# Patient Record
Sex: Female | Born: 1965 | Hispanic: Yes | Marital: Single | Smoking: Never smoker
Health system: Southern US, Community
[De-identification: ages and names within clinical notes are randomized; demographics above are authoritative.]

---

## 2019-01-29 DIAGNOSIS — E049 Nontoxic goiter, unspecified: Secondary | ICD-10-CM | POA: Diagnosis not present

## 2019-07-11 DIAGNOSIS — Z20828 Contact with and (suspected) exposure to other viral communicable diseases: Secondary | ICD-10-CM | POA: Diagnosis not present

## 2019-11-26 DIAGNOSIS — R05 Cough: Secondary | ICD-10-CM | POA: Diagnosis not present

## 2019-11-26 DIAGNOSIS — J069 Acute upper respiratory infection, unspecified: Secondary | ICD-10-CM | POA: Diagnosis not present

## 2019-11-26 DIAGNOSIS — Z9189 Other specified personal risk factors, not elsewhere classified: Secondary | ICD-10-CM | POA: Diagnosis not present

## 2019-11-29 DIAGNOSIS — Z20828 Contact with and (suspected) exposure to other viral communicable diseases: Secondary | ICD-10-CM | POA: Diagnosis not present

## 2020-01-04 DIAGNOSIS — Z20828 Contact with and (suspected) exposure to other viral communicable diseases: Secondary | ICD-10-CM | POA: Diagnosis not present

## 2020-01-04 DIAGNOSIS — Z20822 Contact with and (suspected) exposure to covid-19: Secondary | ICD-10-CM | POA: Diagnosis not present

## 2020-03-23 DIAGNOSIS — Z20822 Contact with and (suspected) exposure to covid-19: Secondary | ICD-10-CM | POA: Diagnosis not present

## 2020-03-23 DIAGNOSIS — Z20828 Contact with and (suspected) exposure to other viral communicable diseases: Secondary | ICD-10-CM | POA: Diagnosis not present

## 2020-03-24 DIAGNOSIS — Z20822 Contact with and (suspected) exposure to covid-19: Secondary | ICD-10-CM | POA: Diagnosis not present

## 2020-12-23 DIAGNOSIS — Z20822 Contact with and (suspected) exposure to covid-19: Secondary | ICD-10-CM | POA: Diagnosis not present

## 2021-02-17 DIAGNOSIS — H00024 Hordeolum internum left upper eyelid: Secondary | ICD-10-CM | POA: Diagnosis not present

## 2021-07-15 ENCOUNTER — Ambulatory Visit
Admission: EM | Admit: 2021-07-15 | Discharge: 2021-07-15 | Disposition: A | Discharge status: Home / Self Care | Payer: Federal, State, Local not specified - PPO | Attending: Urgent Care | Admitting: Urgent Care

## 2021-07-15 ENCOUNTER — Other Ambulatory Visit: Payer: Self-pay

## 2021-07-15 DIAGNOSIS — R52 Pain, unspecified: Secondary | ICD-10-CM

## 2021-07-15 DIAGNOSIS — R519 Headache, unspecified: Secondary | ICD-10-CM

## 2021-07-15 DIAGNOSIS — Z20822 Contact with and (suspected) exposure to covid-19: Secondary | ICD-10-CM

## 2021-07-15 DIAGNOSIS — J069 Acute upper respiratory infection, unspecified: Secondary | ICD-10-CM | POA: Diagnosis not present

## 2021-07-15 MED ORDER — PSEUDOEPHEDRINE HCL 60 MG PO TABS: 60.0000 mg | ORAL_TABLET | Freq: Three times a day (TID) | ORAL | 0 refills | Status: DC | PRN

## 2021-07-15 MED ORDER — PROMETHAZINE-DM 6.25-15 MG/5ML PO SYRP: 5.0000 mL | ORAL_SOLUTION | Freq: Every evening | ORAL | 0 refills | Status: DC | PRN

## 2021-07-15 MED ORDER — CETIRIZINE HCL 10 MG PO TABS: 10.0000 mg | ORAL_TABLET | Freq: Every day | ORAL | 0 refills | Status: AC

## 2021-07-15 MED ORDER — BENZONATATE 100 MG PO CAPS: 100.0000 mg | ORAL_CAPSULE | Freq: Three times a day (TID) | ORAL | 0 refills | Status: DC | PRN

## 2021-07-15 NOTE — ED Provider Notes (Signed)
[facility]   MRN: [id number] DOB: 04-05-1966  Subjective:   Rachael Gamble is a 55 y.o. female presenting for 3-day history of acute onset bilateral ear fullness and pain, sinus congestion, sinus pressure and sinus headaches, sore throat, cough, body aches.  Patient had COVID exposure with her son, tested positive at home.  She took 2 tests and both were negative.  Denies chest pain, shortness of breath, wheezing.  She works at a [employer] and needs to be checked for COVID so that she can return to work.  No current facility-administered medications for this encounter. No current outpatient medications on file.   No Known Allergies  History reviewed. No pertinent past medical history.   History reviewed. No pertinent surgical history.  History reviewed. No pertinent family history.  Social History   Tobacco Use   Smoking status: Never   Smokeless tobacco: Never    ROS   Objective:   Vitals: BP 106/73 (BP Location: Left Arm)   Pulse 82   Temp 98.1 F (36.7 C) (Oral)   Resp 18   SpO2 97%   Physical Exam Constitutional:      General: She is not in acute distress.    Appearance: Normal appearance. She is well-developed. She is not ill-appearing, toxic-appearing or diaphoretic.  HENT:     Head: Normocephalic and atraumatic.     Right Ear: Ear canal and external ear normal. No drainage or tenderness. No middle ear effusion. Tympanic membrane is not erythematous.     Left Ear: Ear canal and external ear normal. No drainage or tenderness.  No middle ear effusion. Tympanic membrane is not erythematous.     Nose: Congestion and rhinorrhea present.     Mouth/Throat:     Mouth: Mucous membranes are moist. No oral lesions.     Pharynx: Oropharynx is clear. No pharyngeal swelling, oropharyngeal exudate, posterior oropharyngeal erythema or uvula swelling.     Tonsils: No tonsillar exudate or tonsillar abscesses.  Eyes:     General: No visual field deficit  or scleral icterus.       Right eye: No discharge.        Left eye: No discharge.     Extraocular Movements: Extraocular movements intact.     Right eye: Normal extraocular motion.     Left eye: Normal extraocular motion.     Conjunctiva/sclera: Conjunctivae normal.     Pupils: Pupils are equal, round, and reactive to light.  Cardiovascular:     Rate and Rhythm: Normal rate and regular rhythm.     Pulses: Normal pulses.     Heart sounds: Normal heart sounds. No murmur heard.   No friction rub. No gallop.  Pulmonary:     Effort: Pulmonary effort is normal. No respiratory distress.     Breath sounds: Normal breath sounds. No stridor. No wheezing, rhonchi or rales.  Musculoskeletal:     Cervical back: Normal range of motion and neck supple.  Lymphadenopathy:     Cervical: No cervical adenopathy.  Skin:    General: Skin is warm and dry.     Findings: No rash.  Neurological:     General: No focal deficit present.     Mental Status: She is alert and oriented to person, place, and time.     Cranial Nerves: No cranial nerve deficit or facial asymmetry.     Motor: No weakness.     Coordination: Romberg sign negative.     Gait: Gait normal.  Psychiatric:  Mood and Affect: Mood normal.        Behavior: Behavior normal.        Thought Content: Thought content normal.        Judgment: Judgment normal.     Assessment and Plan :   PDMP not reviewed this encounter.  1. Viral URI with cough   2. Close exposure to COVID-19 virus   3. Sinus headache   4. Body aches     Will manage for viral illness such as viral URI, viral syndrome, viral rhinitis, COVID-19. Counseled patient on nature of COVID-19 including modes of transmission, diagnostic testing, management and supportive care.  Offered scripts for symptomatic relief. COVID 19 testing is pending. Counseled patient on potential for adverse effects with medications prescribed/recommended today, ER and return-to-clinic precautions  discussed, patient verbalized understanding.     Wallis Bamberg, PA-C 07/15/21 1326

## 2021-07-15 NOTE — Discharge Instructions (Addendum)

## 2021-07-15 NOTE — ED Triage Notes (Signed)
Three day h/o fever, HA, body aches and nausea. Has been taking NyQuil with temporary relief. Emesis x2. Denies diarrhea.

## 2021-07-16 LAB — SARS-COV-2, NAA 2 DAY TAT

## 2021-07-16 LAB — NOVEL CORONAVIRUS, NAA: SARS-CoV-2, NAA: NOT DETECTED

## 2021-07-23 DIAGNOSIS — Z20822 Contact with and (suspected) exposure to covid-19: Secondary | ICD-10-CM | POA: Diagnosis not present

## 2021-07-29 DIAGNOSIS — Z20822 Contact with and (suspected) exposure to covid-19: Secondary | ICD-10-CM | POA: Diagnosis not present

## 2021-08-02 DIAGNOSIS — Z20822 Contact with and (suspected) exposure to covid-19: Secondary | ICD-10-CM | POA: Diagnosis not present

## 2021-08-19 ENCOUNTER — Ambulatory Visit (INDEPENDENT_AMBULATORY_CARE_PROVIDER_SITE_OTHER): Payer: Federal, State, Local not specified - PPO

## 2021-08-19 ENCOUNTER — Ambulatory Visit
Admission: EM | Admit: 2021-08-19 | Discharge: 2021-08-19 | Disposition: A | Discharge status: Home / Self Care | Payer: Federal, State, Local not specified - PPO | Attending: Internal Medicine | Admitting: Internal Medicine

## 2021-08-19 ENCOUNTER — Other Ambulatory Visit: Payer: Self-pay

## 2021-08-19 ENCOUNTER — Encounter: Payer: Self-pay | Admitting: Emergency Medicine

## 2021-08-19 DIAGNOSIS — R059 Cough, unspecified: Secondary | ICD-10-CM

## 2021-08-19 DIAGNOSIS — U099 Post covid-19 condition, unspecified: Secondary | ICD-10-CM

## 2021-08-19 DIAGNOSIS — R0602 Shortness of breath: Secondary | ICD-10-CM | POA: Diagnosis not present

## 2021-08-19 DIAGNOSIS — R053 Chronic cough: Secondary | ICD-10-CM | POA: Diagnosis not present

## 2021-08-19 MED ORDER — PREDNISONE 10 MG (21) PO TBPK: ORAL_TABLET | Freq: Every day | ORAL | 0 refills | Status: DC

## 2021-08-19 MED ORDER — AMOXICILLIN 875 MG PO TABS: 875.0000 mg | ORAL_TABLET | Freq: Two times a day (BID) | ORAL | 0 refills | Status: AC

## 2021-08-19 NOTE — ED Triage Notes (Signed)
Patient was dx w/COVID x 3-4 weeks ago, still coughing, unable to sleep, sometimes hard to catch her breath.  Sinus pain/head and left ear pain.

## 2021-08-19 NOTE — Discharge Instructions (Addendum)
You have been prescribed amoxicillin and steroid to help with symptoms.  Please go the hospital if shortness of breath develops or if symptoms worsen.

## 2021-08-19 NOTE — ED Provider Notes (Signed)
[facility]    CSN: [id number] Arrival date & time: 08/19/21  1006      History   Chief Complaint Chief Complaint  Patient presents with   Cough    HPI Rachael Gamble is a 55 y.o. female.   Patient presents with dry cough, nasal congestion, left ear pain that has been present for approximately 3 to 4 weeks.  Patient was seen on 7/28 for viral upper respiratory infection and had a negative COVID-19 test.  Was prescribed medication to help alleviate symptoms.  Patient has been taking these medications with no relief in symptoms.  States that she had a positive at-home COVID-19 test and a positive PCR test from [facility] approximately 1 week later from urgent care visit.  Denies any known fevers at home.  Denies any chest pain but does have shortness of breath when coughing only.   Cough  History reviewed. No pertinent past medical history.  There are no problems to display for this patient.   History reviewed. No pertinent surgical history.  OB History   No obstetric history on file.      Home Medications    Prior to Admission medications   Medication Sig Start Date End Date Taking? Authorizing Provider  benzonatate (TESSALON) 100 MG capsule Take 1-2 capsules (100-200 mg total) by mouth 3 (three) times daily as needed. 07/15/21   Wallis Bamberg, PA-C  cetirizine (ZYRTEC ALLERGY) 10 MG tablet Take 1 tablet (10 mg total) by mouth daily. 07/15/21   Wallis Bamberg, PA-C  promethazine-dextromethorphan (PROMETHAZINE-DM) 6.25-15 MG/5ML syrup Take 5 mLs by mouth at bedtime as needed for cough. 07/15/21   Wallis Bamberg, PA-C  pseudoephedrine (SUDAFED) 60 MG tablet Take 1 tablet (60 mg total) by mouth every 8 (eight) hours as needed for congestion. 07/15/21   Wallis Bamberg, PA-C    Family History No family history on file.  Social History Social History   Tobacco Use   Smoking status: Never   Smokeless tobacco: Never  Substance Use Topics   Alcohol use: Never   Drug use: Never      Allergies   Patient has no known allergies.   Review of Systems Review of Systems Per HPI  Physical Exam Triage Vital Signs ED Triage Vitals  Enc Vitals Group     BP 08/19/21 1114 116/79     Pulse Rate 08/19/21 1114 72     Resp 08/19/21 1114 20     Temp 08/19/21 1114 98.2 F (36.8 C)     Temp Source 08/19/21 1114 Oral     SpO2 08/19/21 1116 98 %     Weight 08/19/21 1116 139 lb (63 kg)     Height 08/19/21 1116 5\' 2"  (1.575 m)     Head Circumference --      Peak Flow --      Pain Score 08/19/21 1116 0     Pain Loc --      Pain Edu? --      Excl. in GC? --    No data found.  Updated Vital Signs BP 116/79 (BP Location: Right Arm)   Pulse 72   Temp 98.2 F (36.8 C) (Oral)   Resp 20   Ht 5\' 2"  (1.575 m)   Wt 139 lb (63 kg)   SpO2 98%   BMI 25.42 kg/m   Visual Acuity Right Eye Distance:   Left Eye Distance:   Bilateral Distance:    Right Eye Near:   Left Eye Near:  Bilateral Near:     Physical Exam Constitutional:      General: She is not in acute distress.    Appearance: Normal appearance.  HENT:     Head: Normocephalic and atraumatic.     Right Ear: Ear canal normal. A middle ear effusion is present. Tympanic membrane is not erythematous or bulging.     Left Ear: Ear canal normal. A middle ear effusion is present. Tympanic membrane is not erythematous or bulging.     Nose: Congestion present.     Mouth/Throat:     Mouth: Mucous membranes are moist.     Pharynx: No posterior oropharyngeal erythema.  Eyes:     Extraocular Movements: Extraocular movements intact.     Conjunctiva/sclera: Conjunctivae normal.     Pupils: Pupils are equal, round, and reactive to light.  Cardiovascular:     Rate and Rhythm: Normal rate and regular rhythm.     Pulses: Normal pulses.     Heart sounds: Normal heart sounds.  Pulmonary:     Effort: Pulmonary effort is normal. No respiratory distress.     Breath sounds: Normal breath sounds. No wheezing.     Comments:  Harsh cough on exam. Abdominal:     General: Abdomen is flat. Bowel sounds are normal.     Palpations: Abdomen is soft.  Musculoskeletal:        General: Normal range of motion.     Cervical back: Normal range of motion.  Skin:    General: Skin is warm and dry.  Neurological:     General: No focal deficit present.     Mental Status: She is alert and oriented to person, place, and time. Mental status is at baseline.  Psychiatric:        Mood and Affect: Mood normal.        Behavior: Behavior normal.     UC Treatments / Results  Labs (all labs ordered are listed, but only abnormal results are displayed) Labs Reviewed - No data to display  EKG   Radiology No results found.  Procedures Procedures (including critical care time)  Medications Ordered in UC Medications - No data to display  Initial Impression / Assessment and Plan / UC Course  I have reviewed the triage vital signs and the nursing notes.  Pertinent labs & imaging results that were available during my care of the patient were reviewed by me and considered in my medical decision making (see chart for details).     Chest x-ray negative for any acute cardiopulmonary process.  Will treat with Amoxicillin antibiotic due to duration of symptoms and prednisone steroid to decrease inflammation in chest.  Discussed over-the-counter medications to alleviate cough with patient.  Advised patient to go to the hospital shortness if breath develops and is present other than when coughing.  Patient is nontoxic-appearing and does not appear to be in need of immediate medical attention at this time. Discussed strict return precautions. Patient verbalized understanding and is agreeable with plan.  Patient was offered interpreter during patient interaction but patient declined.  Patient voiced understanding to all topics discussed. Final Clinical Impressions(s) / UC Diagnoses   Final diagnoses:  None   Discharge Instructions    None    ED Prescriptions   None    PDMP not reviewed this encounter.   Lance Muss, FNP 08/19/21 1300

## 2021-08-26 ENCOUNTER — Ambulatory Visit
Admission: EM | Admit: 2021-08-26 | Discharge: 2021-08-26 | Disposition: A | Discharge status: Home / Self Care | Payer: Federal, State, Local not specified - PPO | Attending: Urgent Care | Admitting: Urgent Care

## 2021-08-26 ENCOUNTER — Ambulatory Visit (INDEPENDENT_AMBULATORY_CARE_PROVIDER_SITE_OTHER): Payer: Federal, State, Local not specified - PPO

## 2021-08-26 ENCOUNTER — Other Ambulatory Visit: Payer: Self-pay

## 2021-08-26 DIAGNOSIS — S82892A Other fracture of left lower leg, initial encounter for closed fracture: Secondary | ICD-10-CM | POA: Diagnosis not present

## 2021-08-26 DIAGNOSIS — M25572 Pain in left ankle and joints of left foot: Secondary | ICD-10-CM

## 2021-08-26 DIAGNOSIS — W19XXXA Unspecified fall, initial encounter: Secondary | ICD-10-CM | POA: Diagnosis not present

## 2021-08-26 DIAGNOSIS — R600 Localized edema: Secondary | ICD-10-CM | POA: Diagnosis not present

## 2021-08-26 MED ORDER — HYDROCODONE-ACETAMINOPHEN 5-325 MG PO TABS: 1.0000 | ORAL_TABLET | Freq: Four times a day (QID) | ORAL | 0 refills | Status: DC | PRN

## 2021-08-26 MED ORDER — MELOXICAM 7.5 MG PO TABS: 7.5000 mg | ORAL_TABLET | Freq: Every day | ORAL | 0 refills | Status: DC

## 2021-08-26 NOTE — ED Provider Notes (Signed)
[facility]   MRN: [id number] DOB: 1966-07-16  Subjective:   Rachael Gamble is a 55 y.o. female presenting for suffering a left ankle injury 3 days ago.  Patient states that she twisted it and actually came in 2 days ago but we were not able to do x-rays.  Reports that it is still swollen, has bruising now.  Has significant difficulty bearing weight on it.  No current facility-administered medications for this encounter.  Current Outpatient Medications:    amoxicillin (AMOXIL) 875 MG tablet, Take 1 tablet (875 mg total) by mouth 2 (two) times daily for 10 days., Disp: 20 tablet, Rfl: 0   benzonatate (TESSALON) 100 MG capsule, Take 1-2 capsules (100-200 mg total) by mouth 3 (three) times daily as needed., Disp: 60 capsule, Rfl: 0   cetirizine (ZYRTEC ALLERGY) 10 MG tablet, Take 1 tablet (10 mg total) by mouth daily., Disp: 30 tablet, Rfl: 0   predniSONE (STERAPRED UNI-PAK 21 TAB) 10 MG (21) TBPK tablet, Take by mouth daily. Take 6 tabs by mouth daily  for 2 days, then 5 tabs for 2 days, then 4 tabs for 2 days, then 3 tabs for 2 days, 2 tabs for 2 days, then 1 tab by mouth daily for 2 days, Disp: 42 tablet, Rfl: 0   promethazine-dextromethorphan (PROMETHAZINE-DM) 6.25-15 MG/5ML syrup, Take 5 mLs by mouth at bedtime as needed for cough., Disp: 100 mL, Rfl: 0   pseudoephedrine (SUDAFED) 60 MG tablet, Take 1 tablet (60 mg total) by mouth every 8 (eight) hours as needed for congestion., Disp: 30 tablet, Rfl: 0   No Known Allergies  History reviewed. No pertinent past medical history.   History reviewed. No pertinent surgical history.  History reviewed. No pertinent family history.  Social History   Tobacco Use   Smoking status: Never   Smokeless tobacco: Never  Substance Use Topics   Alcohol use: Never   Drug use: Never    ROS   Objective:   Vitals: BP 125/80 (BP Location: Right Arm)   Pulse 66   Temp 98 F (36.7 C) (Oral)   Resp 18   SpO2 98%   Physical  Exam Constitutional:      General: She is not in acute distress.    Appearance: Normal appearance. She is well-developed. She is not ill-appearing.  HENT:     Head: Normocephalic and atraumatic.     Nose: Nose normal.     Mouth/Throat:     Mouth: Mucous membranes are moist.     Pharynx: Oropharynx is clear.  Eyes:     General: No scleral icterus.    Extraocular Movements: Extraocular movements intact.     Pupils: Pupils are equal, round, and reactive to light.  Cardiovascular:     Rate and Rhythm: Normal rate.  Pulmonary:     Effort: Pulmonary effort is normal.  Musculoskeletal:     Left ankle: Swelling and ecchymosis present. No deformity or lacerations. Tenderness present over the lateral malleolus, ATF ligament and AITF ligament. No medial malleolus, CF ligament, posterior TF ligament, base of 5th metatarsal or proximal fibula tenderness. Decreased range of motion.     Left Achilles Tendon: No tenderness or defects. Thompson's test negative.  Skin:    General: Skin is warm and dry.  Neurological:     General: No focal deficit present.     Mental Status: She is alert and oriented to person, place, and time.  Psychiatric:        Mood and  Affect: Mood normal.        Behavior: Behavior normal.    DG Ankle Complete Left  Result Date: 08/26/2021 CLINICAL DATA:  Left ankle pain for 3 days after injury rolling ankle. Pain about the lateral malleolus. EXAM: LEFT ANKLE COMPLETE - 3+ VIEW COMPARISON:  None. FINDINGS: Questionable nondisplaced distal fibular fracture at and just above the level of the ankle mortise, seen only on the oblique view. There is also minimal cortical irregularity about the distal fibular tip. No additional fracture. No mortise widening. Talar dome and base of the fifth metatarsal intact. No significant ankle joint effusion. There is lateral soft tissue edema. Tiny plantar calcaneal spur. IMPRESSION: 1. Suspected distal fibular fracture with 2 areas of questionable  lucency. There is minimal irregularity of the distal fibular tip, as well as an oblique lucency at and just above the level of the ankle mortise. Both of these fractures would be nondisplaced. 2. Lateral soft tissue edema. Electronically Signed   By: Narda Rutherford M.D.   On: 08/26/2021 19:09     Assessment and Plan :   PDMP not reviewed this encounter.  1. Closed fracture of left ankle, initial encounter   2. Acute left ankle pain     Patient placed in a short leg posterior splint.  Use meloxicam, hydrocodone for breakthrough pain.  Ambulate with crutches and in general being nonweightbearing.  Follow-up with an orthopedist as soon as possible. Counseled patient on potential for adverse effects with medications prescribed/recommended today, ER and return-to-clinic precautions discussed, patient verbalized understanding.    Wallis Bamberg, [place] 08/26/21 Rachael Gamble

## 2021-08-26 NOTE — ED Triage Notes (Signed)
Pt "twisted" ankle 3 days ago. States she was here 2 days ago but we did not have xray ability. States it's still hurting. It is clearly edematous and discolored.

## 2021-08-30 ENCOUNTER — Other Ambulatory Visit: Payer: Self-pay | Admitting: Orthopedic Surgery

## 2021-08-30 DIAGNOSIS — S82892A Other fracture of left lower leg, initial encounter for closed fracture: Secondary | ICD-10-CM

## 2021-08-30 DIAGNOSIS — M25572 Pain in left ankle and joints of left foot: Secondary | ICD-10-CM | POA: Diagnosis not present

## 2021-09-03 ENCOUNTER — Ambulatory Visit
Admission: RE | Admit: 2021-09-03 | Discharge: 2021-09-03 | Disposition: A | Discharge status: Home / Self Care | Payer: Federal, State, Local not specified - PPO | Source: Ambulatory Visit | Attending: Orthopedic Surgery | Admitting: Orthopedic Surgery

## 2021-09-03 DIAGNOSIS — S82422A Displaced transverse fracture of shaft of left fibula, initial encounter for closed fracture: Secondary | ICD-10-CM | POA: Diagnosis not present

## 2021-09-03 DIAGNOSIS — S82892A Other fracture of left lower leg, initial encounter for closed fracture: Secondary | ICD-10-CM

## 2021-09-08 DIAGNOSIS — M25572 Pain in left ankle and joints of left foot: Secondary | ICD-10-CM | POA: Diagnosis not present

## 2021-10-01 DIAGNOSIS — N644 Mastodynia: Secondary | ICD-10-CM | POA: Diagnosis not present

## 2021-10-01 DIAGNOSIS — L659 Nonscarring hair loss, unspecified: Secondary | ICD-10-CM | POA: Diagnosis not present

## 2021-10-01 DIAGNOSIS — J302 Other seasonal allergic rhinitis: Secondary | ICD-10-CM | POA: Diagnosis not present

## 2021-10-01 DIAGNOSIS — Z0000 Encounter for general adult medical examination without abnormal findings: Secondary | ICD-10-CM | POA: Diagnosis not present

## 2021-10-01 DIAGNOSIS — E663 Overweight: Secondary | ICD-10-CM | POA: Diagnosis not present

## 2021-10-08 DIAGNOSIS — M25572 Pain in left ankle and joints of left foot: Secondary | ICD-10-CM | POA: Diagnosis not present

## 2021-11-15 DIAGNOSIS — Z1211 Encounter for screening for malignant neoplasm of colon: Secondary | ICD-10-CM | POA: Diagnosis not present

## 2022-02-18 IMAGING — CT CT ANKLE*L* W/O CM
3 series · 16 of 33 positions shown, 19 images · non-contrast
Comparison: 08/26/2021 radiographs

CLINICAL DATA: Left ankle pain, ankle inversion injury.
Questionable lateral malleolar fracture on radiography.

EXAM:
CT OF THE LEFT ANKLE WITHOUT CONTRAST
TECHNIQUE: Multidetector CT imaging of the left ankle was performed according
to the standard protocol. Multiplanar CT image reconstructions were
also generated.

[Series 5: sfov lower extremity 2.00 br40 s3 soft · axial · 0.31mm/px · z∈[+507,+639]mm · 8 of 78 slices shown, 10 images (1 of 3)]
[im 6/78  soft-tissue]
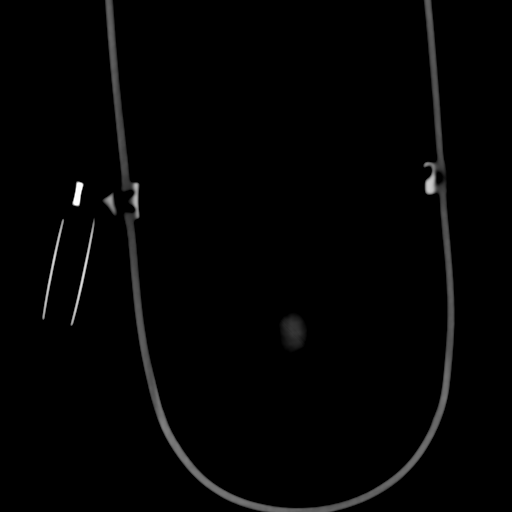
[im 6/78  bone]
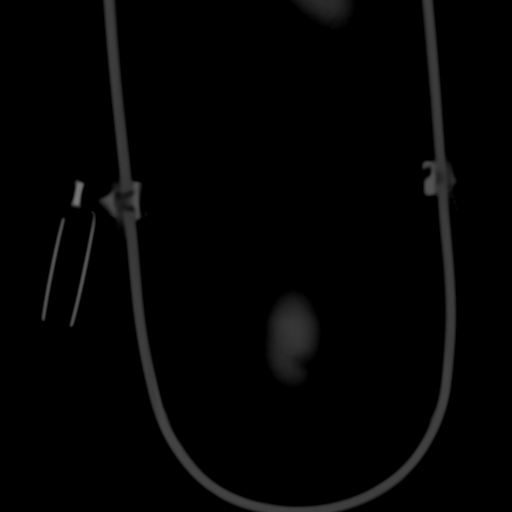
[im 18/78  bone]
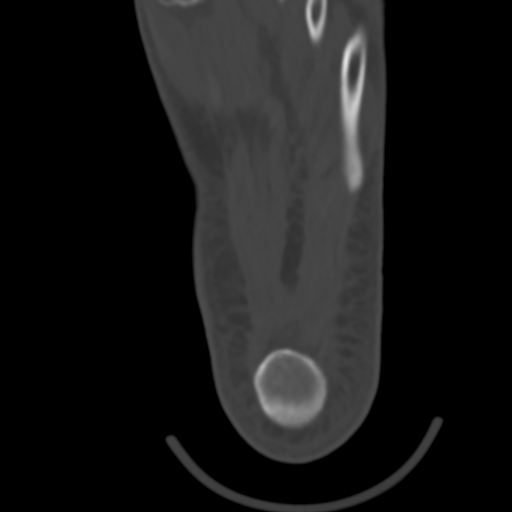
[im 24/78  bone]
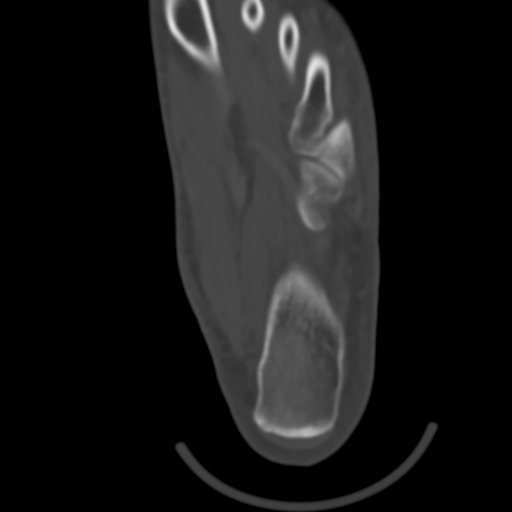
[im 36/78  bone]
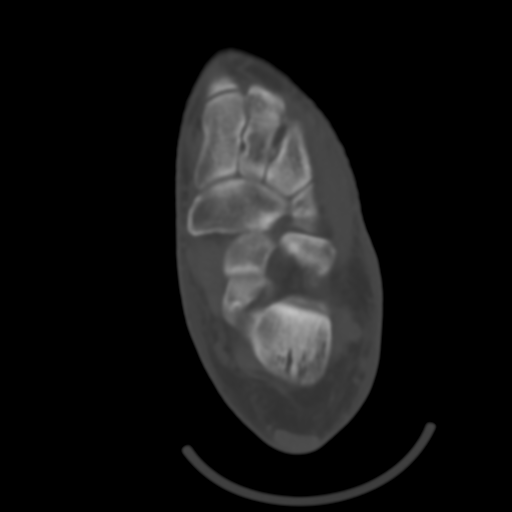
[im 42/78  soft-tissue]
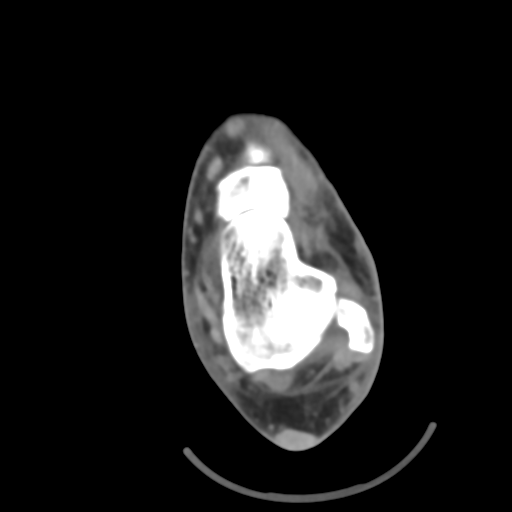
[im 42/78  bone]
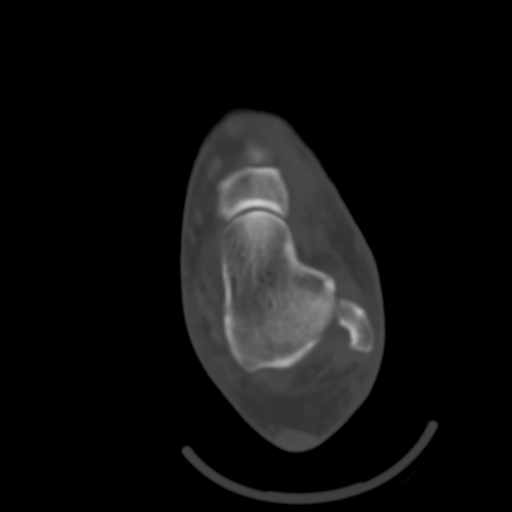
[im 54/78  bone]
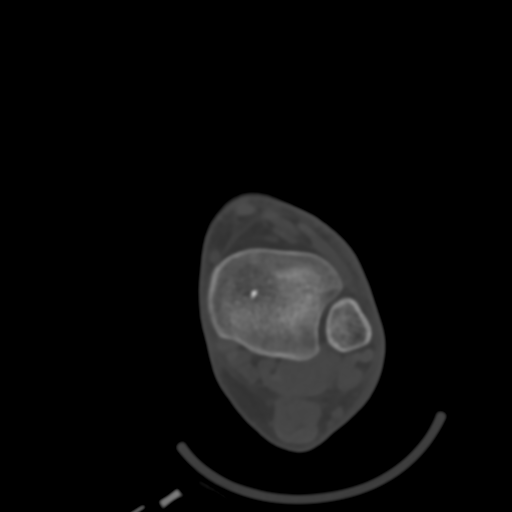
[im 60/78  bone]
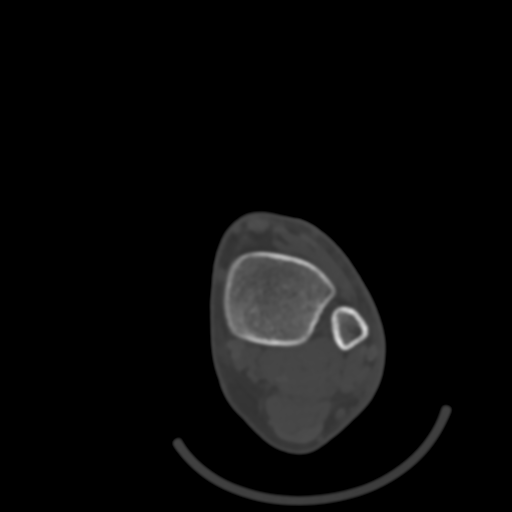
[im 72/78  bone]
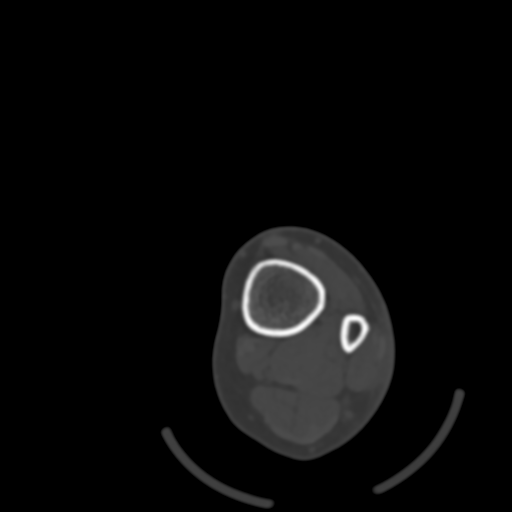

[Series 9: sfov lower extremity 2.00 br40 s3 soft · coronal · 0.31mm/px · 3 of 80 slices shown (2 of 3)]
[im 16/80  bone]
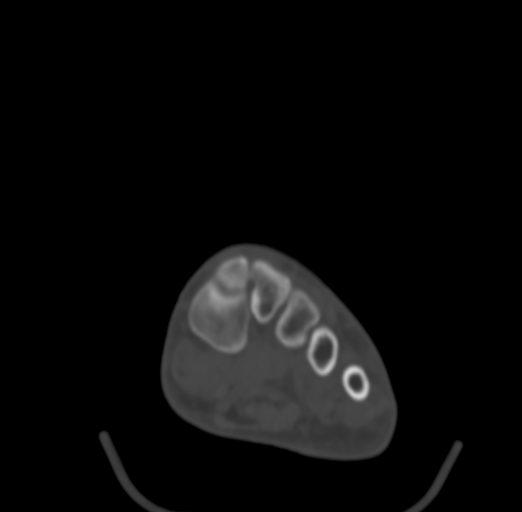
[im 32/80  bone]
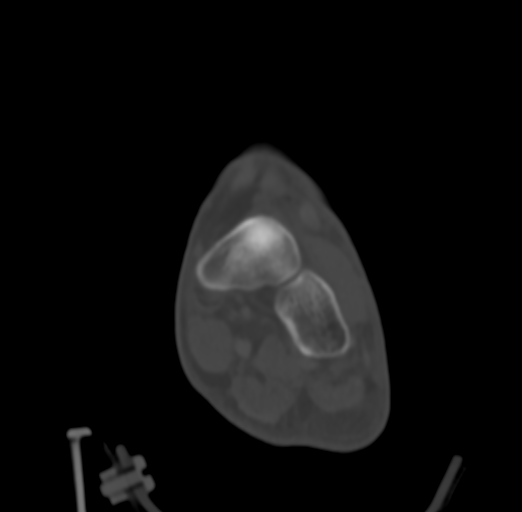
[im 48/80  bone]
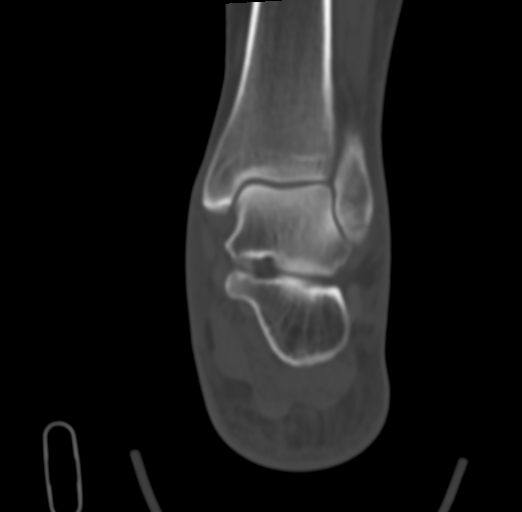

[Series 13: sfov lower extremity 2.00 br40 s3 soft · sagittal · 0.31mm/px · 5 of 80 slices shown, 6 images (3 of 3)]
[im 27/80  bone]
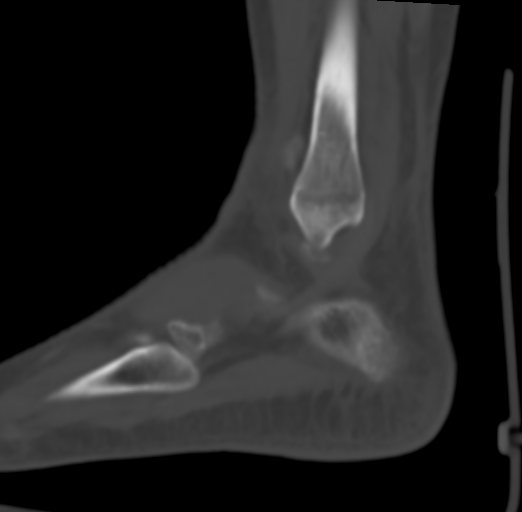
[im 33/80  bone]
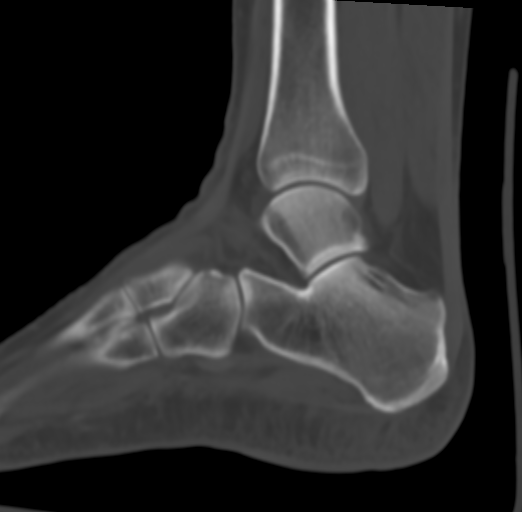
[im 40/80  soft-tissue]
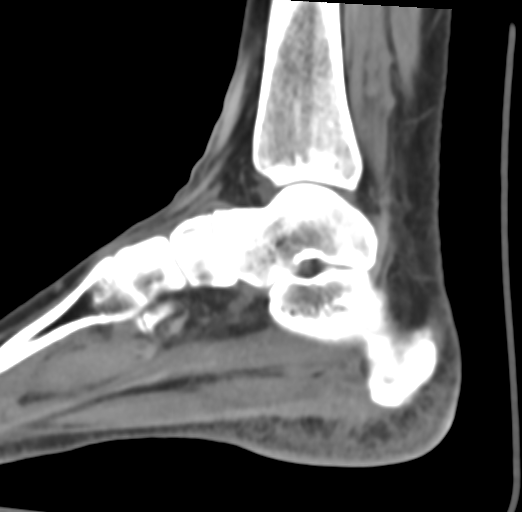
[im 40/80  bone]
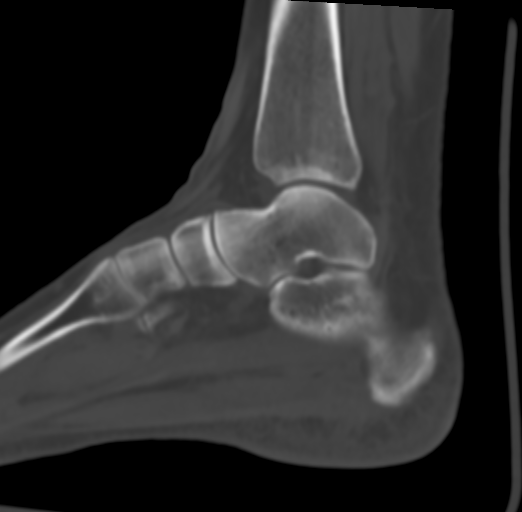
[im 47/80  bone]
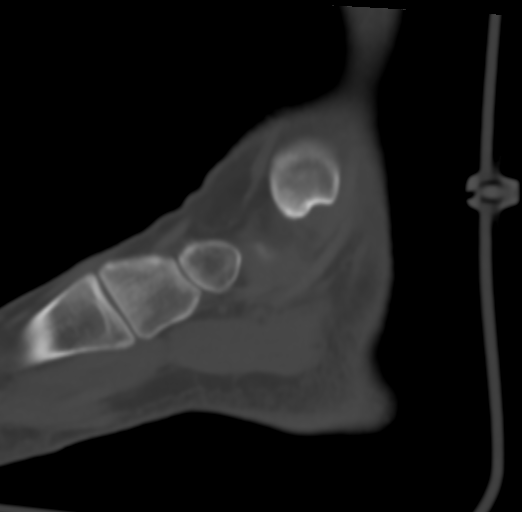
[im 53/80  bone]
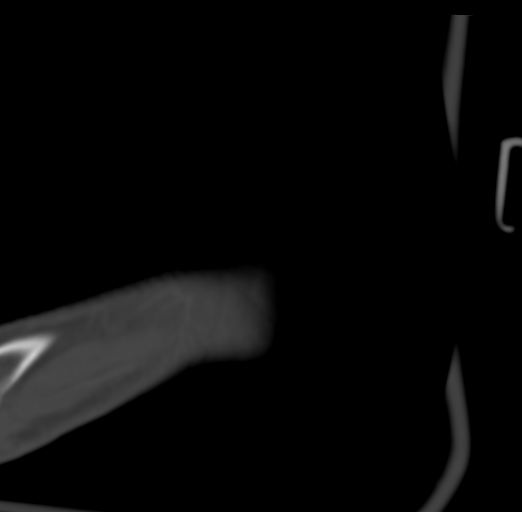

[16 of 33 positions shown; findings below may reference images not displayed]

FINDINGS: Bones/Joint/Cartilage

As shown on images 54-58 of series 11 and image 52 of series 7,
there is linear nondisplaced fracture the tip of the distal tip of
the fibula

Likely incidental linear calcification along the margin of the
peroneus longus tendon on image 51 of series 11.

Ligaments

Suboptimally assessed by CT.

Muscles and Tendons

Thickened medial band of the plantar fascia suspicious for possible
plantar fasciitis. Overlying mild subcutaneous edema in the heel.

Soft tissues

Mild subcutaneous edema overlies both the medial and lateral
malleoli.
IMPRESSION: 1. Transverse fracture of the tip of the fibula, including the
bottom 2-3 mm of the distal fibular tip.
2. Thickened medial band of the plantar fascia suspicious for
plantar fasciitis.

## 2022-04-29 ENCOUNTER — Ambulatory Visit
Admission: EM | Admit: 2022-04-29 | Discharge: 2022-04-29 | Disposition: A | Discharge status: Home / Self Care | Payer: Federal, State, Local not specified - PPO | Attending: Family Medicine | Admitting: Family Medicine

## 2022-04-29 DIAGNOSIS — H1131 Conjunctival hemorrhage, right eye: Secondary | ICD-10-CM | POA: Diagnosis not present

## 2022-04-29 NOTE — ED Provider Notes (Signed)
?[facility] ? ? ? ?CSN: [id number] ?Arrival date & time: 04/29/22  6834 ? ? ?  ? ?History   ?Chief Complaint ?Chief Complaint  ?Patient presents with  ? right eye bleeding  ? ? ?HPI ?Rachael Gamble is a 56 y.o. female.  ? ?HPI ?Here for some irritation in her right eye and blood in the right lower conjunctiva.  This morning when she woke up no abnormalities to her right eye.  Then on the way to work she cried a good bit, thinking about her mother.  Then her right eye started feeling irritated and she noted blood in her lower right eye. ? ?She is worried because she has had what sounds like laser surgery for glaucoma a few years ago.  Last eye checkup was close to a year ago with a history clinic. ? ?MyChart exam shows her right sided vision to be 20/200 and left-sided 20/70 ? ?History reviewed. No pertinent past medical history. ? ?There are no problems to display for this patient. ? ? ?History reviewed. No pertinent surgical history. ? ?OB History   ?No obstetric history on file. ?  ? ? ? ?Home Medications   ? ?Prior to Admission medications   ?Medication Sig Start Date End Date Taking? Authorizing Provider  ?cetirizine (ZYRTEC ALLERGY) 10 MG tablet Take 1 tablet (10 mg total) by mouth daily. 07/15/21   Wallis Bamberg, PA-C  ?meloxicam (MOBIC) 7.5 MG tablet Take 1 tablet (7.5 mg total) by mouth daily. 08/26/21   Wallis Bamberg, PA-C  ? ? ?Family History ?History reviewed. No pertinent family history. ? ?Social History ?Social History  ? ?Tobacco Use  ? Smoking status: Never  ? Smokeless tobacco: Never  ?Substance Use Topics  ? Alcohol use: Never  ? Drug use: Never  ? ? ? ?Allergies   ?Patient has no known allergies. ? ? ?Review of Systems ?Review of Systems ? ? ?Physical Exam ?Triage Vital Signs ?ED Triage Vitals [04/29/22 0903]  ?Enc Vitals Group  ?   BP 136/83  ?   Pulse Rate 72  ?   Resp 18  ?   Temp 98.2 ?F (36.8 ?C)  ?   Temp Source Oral  ?   SpO2 98 %  ?   Weight   ?   Height   ?   Head Circumference   ?    Peak Flow   ?   Pain Score 0  ?   Pain Loc   ?   Pain Edu?   ?   Excl. in GC?   ? ?No data found. ? ?Updated Vital Signs ?BP 136/83 (BP Location: Left Arm)   Pulse 72   Temp 98.2 ?F (36.8 ?C) (Oral)   Resp 18   SpO2 98%  ? ?Visual Acuity ?Right Eye Distance: 20/200 ?Left Eye Distance: 20/70 ?Bilateral Distance: 20/70 ? ?Right Eye Near:   ?Left Eye Near:    ?Bilateral Near:    ? ?Physical Exam ?Vitals reviewed.  ?Constitutional:   ?   General: She is not in acute distress. ?   Appearance: She is not toxic-appearing.  ?HENT:  ?   Mouth/Throat:  ?   Mouth: Mucous membranes are moist.  ?Eyes:  ?   Extraocular Movements: Extraocular movements intact.  ?   Pupils: Pupils are equal, round, and reactive to light.  ?   Comments: Conjunctival hemorrhage on the lower portion of her right eye across the entirety of the eye.  The lids are not  swollen she does have some tearing  ?Skin: ?   Coloration: Skin is not pale.  ?Neurological:  ?   Mental Status: She is alert and oriented to person, place, and time.  ?Psychiatric:     ?   Behavior: Behavior normal.  ? ? ? ?UC Treatments / Results  ?Labs ?(all labs ordered are listed, but only abnormal results are displayed) ?Labs Reviewed - No data to display ? ?EKG ? ? ?Radiology ?No results found. ? ?Procedures ?Procedures (including critical care time) ? ?Medications Ordered in UC ?Medications - No data to display ? ?Initial Impression / Assessment and Plan / UC Course  ?I have reviewed the triage vital signs and the nursing notes. ? ?Pertinent labs & imaging results that were available during my care of the patient were reviewed by me and considered in my medical decision making (see chart for details). ? ?  ? ?It is unclear if the difference in visual acuity is due to her previous surgery and eye problems.  Exam is consistent with a subconjunctival hemorrhage and not glaucoma.  She is given contact information for ophthalmology.  If she is worsening in any way in these next few  days she should proceed to the emergency room for slit-lamp exam. ?Final Clinical Impressions(s) / UC Diagnoses  ? ?Final diagnoses:  ?Subconjunctival hemorrhage of right eye  ? ? ? ?Discharge Instructions   ? ?  ?Your exam is most consistent with a subconjunctival hemorrhage. It can take a couple of weeks for it to resolve. ? ?Get an over the counter eye drop like Refresh or Systane, and keep it in the fridge. You can use it as needed for the eye irritation. ? ?If your symptoms worsen, go to the ER for urgent evaluation.  ? ?Also consider calling the ophthalmologist office today to see if they could evaluate you. ? ? ? ? ?ED Prescriptions   ?None ?  ? ?PDMP not reviewed this encounter. ?  ?Zenia Resides, MD ?04/29/22 [phone] ? ?

## 2022-04-29 NOTE — ED Triage Notes (Signed)
Pt c/o vision changes, right eye pain, bleeding in right conjunctiva. Onset this morning. Denies any direct trauma or injury but states was crying this morning and that's the only thing she can think of that would have affected her eyes. States when she woke up her eyes were normal but by the time she got to work she noticed marked changes. Associated dizziness and nausea.  ?

## 2022-04-29 NOTE — Discharge Instructions (Addendum)
Your exam is most consistent with a subconjunctival hemorrhage. It can take a couple of weeks for it to resolve. ? ?Get an over the counter eye drop like Refresh or Systane, and keep it in the fridge. You can use it as needed for the eye irritation. ? ?If your symptoms worsen, go to the ER for urgent evaluation.  ? ?Also consider calling the ophthalmologist office today to see if they could evaluate you. ?

## 2022-05-03 DIAGNOSIS — H00021 Hordeolum internum right upper eyelid: Secondary | ICD-10-CM | POA: Diagnosis not present

## 2022-11-07 DIAGNOSIS — Z0000 Encounter for general adult medical examination without abnormal findings: Secondary | ICD-10-CM | POA: Diagnosis not present

## 2022-11-07 DIAGNOSIS — R7301 Impaired fasting glucose: Secondary | ICD-10-CM | POA: Diagnosis not present

## 2022-11-07 DIAGNOSIS — Z124 Encounter for screening for malignant neoplasm of cervix: Secondary | ICD-10-CM | POA: Diagnosis not present

## 2022-11-07 DIAGNOSIS — E7800 Pure hypercholesterolemia, unspecified: Secondary | ICD-10-CM | POA: Diagnosis not present

## 2022-11-21 DIAGNOSIS — H04123 Dry eye syndrome of bilateral lacrimal glands: Secondary | ICD-10-CM | POA: Diagnosis not present

## 2022-11-21 DIAGNOSIS — H40033 Anatomical narrow angle, bilateral: Secondary | ICD-10-CM | POA: Diagnosis not present

## 2023-01-02 ENCOUNTER — Ambulatory Visit
Admission: EM | Admit: 2023-01-02 | Discharge: 2023-01-02 | Disposition: A | Discharge status: Home / Self Care | Payer: Federal, State, Local not specified - PPO | Attending: Physician Assistant | Admitting: Physician Assistant

## 2023-01-02 ENCOUNTER — Ambulatory Visit (INDEPENDENT_AMBULATORY_CARE_PROVIDER_SITE_OTHER): Payer: Federal, State, Local not specified - PPO

## 2023-01-02 DIAGNOSIS — R051 Acute cough: Secondary | ICD-10-CM | POA: Diagnosis not present

## 2023-01-02 DIAGNOSIS — J209 Acute bronchitis, unspecified: Secondary | ICD-10-CM | POA: Diagnosis not present

## 2023-01-02 DIAGNOSIS — R0602 Shortness of breath: Secondary | ICD-10-CM

## 2023-01-02 DIAGNOSIS — R059 Cough, unspecified: Secondary | ICD-10-CM | POA: Diagnosis not present

## 2023-01-02 MED ORDER — AEROCHAMBER PLUS FLO-VU MEDIUM MISC
1.0000 | Freq: Once | Status: AC
  Administered 2023-01-02: 1

## 2023-01-02 MED ORDER — BENZONATATE 100 MG PO CAPS: 100.0000 mg | ORAL_CAPSULE | Freq: Three times a day (TID) | ORAL | 0 refills | Status: DC

## 2023-01-02 MED ORDER — ALBUTEROL SULFATE HFA 108 (90 BASE) MCG/ACT IN AERS
2.0000 | INHALATION_SPRAY | Freq: Once | RESPIRATORY_TRACT | Status: AC
  Administered 2023-01-02: 2 via RESPIRATORY_TRACT

## 2023-01-02 MED ORDER — PREDNISONE 10 MG PO TABS: 10.0000 mg | ORAL_TABLET | Freq: Three times a day (TID) | ORAL | 0 refills | Status: DC

## 2023-01-02 NOTE — ED Provider Notes (Signed)
[facility]    CSN: [id number] Arrival date & time: 01/02/23  1306      History   Chief Complaint Chief Complaint  Patient presents with   Cough    HPI Rachael Gamble is a 57 y.o. female.   57 year old female presents with chronic cough.  Patient indicates she had a respiratory infection about 6 weeks ago.  She indicates it was not COVID.  Since having the respiratory infection she has been having a chronic cough for the past 6 weeks.  She relates the cough is dry, persistent, and she has having coughing exacerbations.  She occasionally has shortness of breath with the cough however there is no noted wheezing.  She indicates she has taken multiple OTC cough preparations without improvement in her symptoms.  Indicates she is not having any fever, chills, upper respiratory symptoms.  She indicates that she is a non-smoker.  She does indicate that she had COVID last year and had similar symptoms with a chronic nagging cough but it improved with a course of steroids.  She is currently tolerating fluids well.   Cough Associated symptoms: shortness of breath     History reviewed. No pertinent past medical history.  There are no problems to display for this patient.   History reviewed. No pertinent surgical history.  OB History   No obstetric history on file.      Home Medications    Prior to Admission medications   Medication Sig Start Date End Date Taking? Authorizing Provider  benzonatate (TESSALON) 100 MG capsule Take 1 capsule (100 mg total) by mouth every 8 (eight) hours. 01/02/23  Yes Nyoka Lint, PA-C  predniSONE (DELTASONE) 10 MG tablet Take 1 tablet (10 mg total) by mouth in the morning, at noon, and at bedtime. 01/02/23  Yes Nyoka Lint, PA-C  cetirizine (ZYRTEC ALLERGY) 10 MG tablet Take 1 tablet (10 mg total) by mouth daily. 07/15/21   Jaynee Eagles, PA-C  meloxicam (MOBIC) 7.5 MG tablet Take 1 tablet (7.5 mg total) by mouth daily. 08/26/21   Jaynee Eagles,  PA-C    Family History Family History  Family history unknown: Yes    Social History Social History   Tobacco Use   Smoking status: Never   Smokeless tobacco: Never  Substance Use Topics   Alcohol use: Never   Drug use: Never     Allergies   Patient has no known allergies.   Review of Systems Review of Systems  Respiratory:  Positive for cough and shortness of breath.      Physical Exam Triage Vital Signs ED Triage Vitals  Enc Vitals Group     BP 01/02/23 1314 104/69     Pulse Rate 01/02/23 1312 80     Resp 01/02/23 1312 17     Temp 01/02/23 1312 97.8 F (36.6 C)     Temp Source 01/02/23 1312 Oral     SpO2 01/02/23 1312 96 %     Weight --      Height --      Head Circumference --      Peak Flow --      Pain Score 01/02/23 1312 1     Pain Loc --      Pain Edu? --      Excl. in [place]? --    No data found.  Updated Vital Signs BP 104/69   Pulse 80   Temp 97.8 F (36.6 C) (Oral)   Resp 17   SpO2 96%  Visual Acuity Right Eye Distance:   Left Eye Distance:   Bilateral Distance:    Right Eye Near:   Left Eye Near:    Bilateral Near:     Physical Exam Constitutional:      Appearance: Normal appearance.  HENT:     Right Ear: Tympanic membrane and ear canal normal.     Left Ear: Tympanic membrane and ear canal normal.     Mouth/Throat:     Mouth: Mucous membranes are moist.     Pharynx: Oropharynx is clear.  Cardiovascular:     Rate and Rhythm: Normal rate and regular rhythm.     Heart sounds: Normal heart sounds.  Pulmonary:     Effort: Pulmonary effort is normal.     Breath sounds: Normal breath sounds and air entry. No wheezing, rhonchi or rales.  Lymphadenopathy:     Cervical: No cervical adenopathy.  Neurological:     Mental Status: She is alert.      UC Treatments / Results  Labs (all labs ordered are listed, but only abnormal results are displayed) Labs Reviewed - No data to display  EKG   Radiology DG Chest 2  View  Result Date: 01/02/2023 CLINICAL DATA:  Cough mild shortness of breath over the last 6 weeks EXAM: CHEST - 2 VIEW COMPARISON:  08/19/2021 FINDINGS: The heart size and mediastinal contours are within normal limits. Both lungs are clear. The visualized skeletal structures are unremarkable. Bilateral breast implants noted. IMPRESSION: 1. No active cardiopulmonary disease is radiographically apparent. Electronically Signed   By: Gaylyn Rong M.D.   On: 01/02/2023 13:59    Procedures Procedures (including critical care time)  Medications Ordered in UC Medications  albuterol (VENTOLIN HFA) 108 (90 Base) MCG/ACT inhaler 2 puff (2 puffs Inhalation Provided for home use 01/02/23 1406)  AeroChamber Plus Flo-Vu Medium MISC 1 each (1 each Other Provided for home use 01/02/23 1406)    Initial Impression / Assessment and Plan / UC Course  I have reviewed the triage vital signs and the nursing notes.  Pertinent labs & imaging results that were available during my care of the patient were reviewed by me and considered in my medical decision making (see chart for details).    Plan: 1.  The cough will be treated with the following: A.  Tessalon Perles 100 mg every 8 hours as needed for cough. 2.  The acute bronchitis will be treated with the following: A.  Albuterol inhaler with spacer, 2 puffs every 6 hours on a regular basis to help treat the cough and respiratory irritation. B.  Prednisone 10 mg every 8 hours until completed to help reduce the respiratory inflammatory process. 3.  Patient advised to follow-up with PCP or return to urgent care if symptoms fail to improve over the next 2 weeks. Final Clinical Impressions(s) / UC Diagnoses   Final diagnoses:  Acute cough  Acute bronchitis, unspecified organism     Discharge Instructions      Advised to take the Tessalon Perles 100 mg every 8 hours as needed for cough.  Advised to use the albuterol inhaler with spacer, 2 puffs every 6  hours on a regular basis to help decrease cough and chest irritability.  Advised take prednisone 10 mg 3 times a day until completed to help reduce the respiratory inflammatory process.  Advised follow-up PCP or return to urgent care if symptoms fail to improve.    ED Prescriptions     Medication Sig Dispense Auth.  Provider   predniSONE (DELTASONE) 10 MG tablet Take 1 tablet (10 mg total) by mouth in the morning, at noon, and at bedtime. 15 tablet Nyoka Lint, PA-C   benzonatate (TESSALON) 100 MG capsule Take 1 capsule (100 mg total) by mouth every 8 (eight) hours. 21 capsule Nyoka Lint, PA-C      PDMP not reviewed this encounter.   Nyoka Lint, PA-C 01/02/23 1411

## 2023-01-02 NOTE — Discharge Instructions (Addendum)
Advised to take the Tessalon Perles 100 mg every 8 hours as needed for cough.  Advised to use the albuterol inhaler with spacer, 2 puffs every 6 hours on a regular basis to help decrease cough and chest irritability.  Advised take prednisone 10 mg 3 times a day until completed to help reduce the respiratory inflammatory process.  Advised follow-up PCP or return to urgent care if symptoms fail to improve.

## 2023-01-02 NOTE — ED Triage Notes (Signed)
Pt presents with non productive cough X 1 month.

## 2023-09-04 ENCOUNTER — Ambulatory Visit
Admission: EM | Admit: 2023-09-04 | Discharge: 2023-09-04 | Disposition: A | Discharge status: Home / Self Care | Payer: Federal, State, Local not specified - PPO | Attending: Family Medicine | Admitting: Family Medicine

## 2023-09-04 ENCOUNTER — Encounter: Payer: Self-pay | Admitting: *Deleted

## 2023-09-04 ENCOUNTER — Other Ambulatory Visit: Payer: Self-pay

## 2023-09-04 DIAGNOSIS — J069 Acute upper respiratory infection, unspecified: Secondary | ICD-10-CM

## 2023-09-04 DIAGNOSIS — H109 Unspecified conjunctivitis: Secondary | ICD-10-CM | POA: Diagnosis not present

## 2023-09-04 MED ORDER — POLYMYXIN B-TRIMETHOPRIM 10000-0.1 UNIT/ML-% OP SOLN: 1.0000 [drp] | Freq: Four times a day (QID) | OPHTHALMIC | 0 refills | Status: AC

## 2023-09-04 NOTE — Discharge Instructions (Signed)
You were seen today for upper respiratory symptoms.  You symptoms appear viral at this time.  I recommend over the counter zyrtec and flonase for your symptoms.  I recommend tylenol or motrin for pain and body aches.  If your symptoms continue after 7 days, then please return for re-evaluation.  I have sent out eye drops for possible pink eye.

## 2023-09-04 NOTE — ED Triage Notes (Signed)
C/o both eyes red and dry, sore throat, body aches x 2 days. Her husband is here with the same Sx

## 2023-09-04 NOTE — ED Provider Notes (Signed)
[facility]    CSN: [id number] Arrival date & time: 09/04/23  1158      History   Chief Complaint Chief Complaint  Patient presents with   Generalized Body Aches    HPI Rachael Gamble is a 57 y.o. female.   Patient is here for headache, body aches, eye pain and itching.  She started with symptoms yesterday.  No fevers/chills.  +runny nose, congestion.  Mild cough.  She used otc nyquil/dayquil.  Her husband is here with similar symptoms for over a week, treated for acute sinusitis and pink eye.        History reviewed. No pertinent past medical history.  There are no problems to display for this patient.   History reviewed. No pertinent surgical history.  OB History   No obstetric history on file.      Home Medications    Prior to Admission medications   Medication Sig Start Date End Date Taking? Authorizing Provider  benzonatate (TESSALON) 100 MG capsule Take 1 capsule (100 mg total) by mouth every 8 (eight) hours. 01/02/23   Ellsworth Lennox, PA-C  cetirizine (ZYRTEC ALLERGY) 10 MG tablet Take 1 tablet (10 mg total) by mouth daily. 07/15/21   Wallis Bamberg, PA-C  meloxicam (MOBIC) 7.5 MG tablet Take 1 tablet (7.5 mg total) by mouth daily. 08/26/21   Wallis Bamberg, PA-C  predniSONE (DELTASONE) 10 MG tablet Take 1 tablet (10 mg total) by mouth in the morning, at noon, and at bedtime. 01/02/23   Ellsworth Lennox, PA-C    Family History Family History  Family history unknown: Yes    Social History Social History   Tobacco Use   Smoking status: Never   Smokeless tobacco: Never  Substance Use Topics   Alcohol use: Never   Drug use: Never     Allergies   Patient has no known allergies.   Review of Systems Review of Systems  Constitutional:  Positive for fatigue. Negative for fever.  HENT:  Positive for congestion, rhinorrhea and sore throat.   Respiratory: Negative.    Cardiovascular: Negative.   Gastrointestinal: Negative.   Musculoskeletal:  Negative.   Psychiatric/Behavioral: Negative.       Physical Exam Triage Vital Signs ED Triage Vitals  Encounter Vitals Group     BP 09/04/23 1421 (!) 108/59     Systolic BP Percentile --      Diastolic BP Percentile --      Pulse Rate 09/04/23 1421 71     Resp 09/04/23 1421 18     Temp 09/04/23 1421 98 F (36.7 C)     Temp Source 09/04/23 1421 Oral     SpO2 09/04/23 1421 97 %     Weight --      Height --      Head Circumference --      Peak Flow --      Pain Score 09/04/23 1423 8     Pain Loc --      Pain Education --      Exclude from Growth Chart --    No data found.  Updated Vital Signs BP (!) 108/59 (BP Location: Left Arm)   Pulse 71   Temp 98 F (36.7 C) (Oral)   Resp 18   SpO2 97%   Visual Acuity Right Eye Distance:   Left Eye Distance:   Bilateral Distance:    Right Eye Near:   Left Eye Near:    Bilateral Near:     Physical Exam  Constitutional:      Appearance: Normal appearance.  HENT:     Right Ear: Tympanic membrane normal.     Left Ear: Tympanic membrane normal.     Nose: No congestion or rhinorrhea.     Right Sinus: No maxillary sinus tenderness or frontal sinus tenderness.     Left Sinus: No maxillary sinus tenderness or frontal sinus tenderness.     Mouth/Throat:     Mouth: Mucous membranes are moist.  Eyes:     General: Lids are normal.     Extraocular Movements: Extraocular movements intact.     Conjunctiva/sclera:     Right eye: Right conjunctiva is injected.     Left eye: Left conjunctiva is injected.  Cardiovascular:     Rate and Rhythm: Normal rate and regular rhythm.  Pulmonary:     Effort: Pulmonary effort is normal.     Breath sounds: Normal breath sounds.  Musculoskeletal:     Cervical back: Normal range of motion and neck supple.  Neurological:     General: No focal deficit present.     Mental Status: She is alert.  Psychiatric:        Mood and Affect: Mood normal.      UC Treatments / Results  Labs (all labs  ordered are listed, but only abnormal results are displayed) Labs Reviewed - No data to display  EKG   Radiology No results found.  Procedures Procedures (including critical care time)  Medications Ordered in UC Medications - No data to display  Initial Impression / Assessment and Plan / UC Course  I have reviewed the triage vital signs and the nursing notes.  Pertinent labs & imaging results that were available during my care of the patient were reviewed by me and considered in my medical decision making (see chart for details).  Final Clinical Impressions(s) / UC Diagnoses   Final diagnoses:  Acute upper respiratory infection  Bacterial conjunctivitis     Discharge Instructions      You were seen today for upper respiratory symptoms.  You symptoms appear viral at this time.  I recommend over the counter zyrtec and flonase for your symptoms.  I recommend tylenol or motrin for pain and body aches.  If your symptoms continue after 7 days, then please return for re-evaluation.  I have sent out eye drops for possible pink eye.      ED Prescriptions     Medication Sig Dispense Auth. Provider   trimethoprim-polymyxin b (POLYTRIM) ophthalmic solution Place 1 drop into both eyes every 6 (six) hours for 7 days. 10 mL Jannifer Franklin, MD      PDMP not reviewed this encounter.   Jannifer Franklin, MD 09/04/23 [phone]

## 2023-09-05 DIAGNOSIS — H10013 Acute follicular conjunctivitis, bilateral: Secondary | ICD-10-CM | POA: Diagnosis not present

## 2023-11-13 DIAGNOSIS — E7800 Pure hypercholesterolemia, unspecified: Secondary | ICD-10-CM | POA: Diagnosis not present

## 2023-11-13 DIAGNOSIS — R0982 Postnasal drip: Secondary | ICD-10-CM | POA: Diagnosis not present

## 2023-11-13 DIAGNOSIS — R7303 Prediabetes: Secondary | ICD-10-CM | POA: Diagnosis not present

## 2023-11-13 DIAGNOSIS — N644 Mastodynia: Secondary | ICD-10-CM | POA: Diagnosis not present

## 2023-11-13 DIAGNOSIS — Z0000 Encounter for general adult medical examination without abnormal findings: Secondary | ICD-10-CM | POA: Diagnosis not present

## 2023-11-27 DIAGNOSIS — N644 Mastodynia: Secondary | ICD-10-CM | POA: Diagnosis not present

## 2024-11-25 DIAGNOSIS — M542 Cervicalgia: Secondary | ICD-10-CM | POA: Diagnosis not present
